# Patient Record
Sex: Male | Born: 1995 | Race: White | Hispanic: No | Marital: Single | Smoking: Never smoker
Health system: Southern US, Community
[De-identification: ages and names within clinical notes are randomized; demographics above are authoritative.]

## PROBLEM LIST (undated history)

## (undated) DIAGNOSIS — F32A Depression, unspecified: Secondary | ICD-10-CM

## (undated) DIAGNOSIS — F329 Major depressive disorder, single episode, unspecified: Secondary | ICD-10-CM

## (undated) DIAGNOSIS — F429 Obsessive-compulsive disorder, unspecified: Secondary | ICD-10-CM

## (undated) DIAGNOSIS — F419 Anxiety disorder, unspecified: Secondary | ICD-10-CM

## (undated) HISTORY — PX: TONSILLECTOMY: SUR1361

## (undated) HISTORY — DX: Anxiety disorder, unspecified: F41.9

## (undated) HISTORY — PX: IM NAILING TIBIA: SUR734

## (undated) HISTORY — DX: Obsessive-compulsive disorder, unspecified: F42.9

---
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
## 2014-01-07 ENCOUNTER — Other Ambulatory Visit: Payer: Self-pay | Admitting: Pediatrics

## 2014-01-07 ENCOUNTER — Telehealth: Payer: Self-pay | Admitting: Pediatrics

## 2014-01-07 DIAGNOSIS — F429 Obsessive-compulsive disorder, unspecified: Secondary | ICD-10-CM

## 2014-01-07 MED ORDER — SERTRALINE HCL 100 MG PO TABS: ORAL_TABLET | ORAL | Status: DC

## 2014-01-07 NOTE — Telephone Encounter (Signed)
i can give him 3 months worth now, but he needs to transition to an adult provider now that he is over 65 y.

## 2014-01-07 NOTE — Telephone Encounter (Signed)
Mom was calling in regards to patient wanting to know if he needs an appt to refill his sertraline script. Please advise.

## 2014-01-07 NOTE — Telephone Encounter (Signed)
See Below:

## 2014-04-21 ENCOUNTER — Ambulatory Visit (INDEPENDENT_AMBULATORY_CARE_PROVIDER_SITE_OTHER): Payer: Medicaid Other | Admitting: Pediatrics

## 2014-04-21 ENCOUNTER — Encounter: Payer: Self-pay | Admitting: Pediatrics

## 2014-04-21 VITALS — BP 118/60 | Wt 170.0 lb

## 2014-04-21 DIAGNOSIS — F429 Obsessive-compulsive disorder, unspecified: Secondary | ICD-10-CM

## 2014-04-21 MED ORDER — SERTRALINE HCL 100 MG PO TABS: ORAL_TABLET | ORAL | Status: DC

## 2014-04-21 NOTE — Progress Notes (Signed)
   Subjective:    Patient ID: Ricky [name], male    DOB: 03-Jun-1996, 18 y.o.   MRN: [id number]  HPI 18 year old male here for refill on Zoloft for OCD. He is doing great in college with no complaints or concerns about his OCD.   Review of Systems Noncontributory    Objective:   Physical Exam Alert and oriented Throat clear Ears clear Neck no adenopathy or thyromegaly Lungs clear to auscultation Heart regular rhythm without murmur Abdomen soft without masses       Assessment & Plan:  OCD good control on Zoloft Plan Zoloft 200 mg daily Return for physical and late November or December

## 2014-11-17 ENCOUNTER — Ambulatory Visit (INDEPENDENT_AMBULATORY_CARE_PROVIDER_SITE_OTHER): Payer: Medicaid Other | Admitting: Pediatrics

## 2014-11-17 ENCOUNTER — Encounter: Payer: Self-pay | Admitting: Pediatrics

## 2014-11-17 VITALS — BP 100/62

## 2014-11-17 DIAGNOSIS — F429 Obsessive-compulsive disorder, unspecified: Secondary | ICD-10-CM

## 2014-11-17 DIAGNOSIS — F42 Obsessive-compulsive disorder: Secondary | ICD-10-CM | POA: Diagnosis not present

## 2014-11-17 MED ORDER — SERTRALINE HCL 100 MG PO TABS: ORAL_TABLET | ORAL | Status: DC

## 2014-11-17 NOTE — Patient Instructions (Signed)
Please continue medications as prescribed We will see you back in 2-3 months for your physical   Sertraline tablets What is this medicine? SERTRALINE (SER tra leen) is used to treat depression. It may also be used to treat obsessive compulsive disorder, panic disorder, post-trauma stress, premenstrual dysphoric disorder (PMDD) or social anxiety. This medicine may be used for other purposes; ask your health care provider or pharmacist if you have questions. COMMON BRAND NAME(S): Zoloft What should I tell my health care provider before I take this medicine? They need to know if you have any of these conditions: -bipolar disorder or a family history of bipolar disorder -diabetes -glaucoma -heart disease -high blood pressure -history of irregular heartbeat -history of low levels of calcium, magnesium, or potassium in the blood -if you often drink alcohol -liver disease -receiving electroconvulsive therapy -seizures -suicidal thoughts, plans, or attempt; a previous suicide attempt by you or a family member -thyroid disease -an unusual or allergic reaction to sertraline, other medicines, foods, dyes, or preservatives -pregnant or trying to get pregnant -breast-feeding How should I use this medicine? Take this medicine by mouth with a glass of water. Follow the directions on the prescription label. You can take it with or without food. Take your medicine at regular intervals. Do not take your medicine more often than directed. Do not stop taking this medicine suddenly except upon the advice of your doctor. Stopping this medicine too quickly may cause serious side effects or your condition may worsen. A special MedGuide will be given to you by the pharmacist with each prescription and refill. Be sure to read this information carefully each time. Talk to your pediatrician regarding the use of this medicine in children. While this drug may be prescribed for children as young as 7 years for  selected conditions, precautions do apply. Overdosage: If you think you have taken too much of this medicine contact a poison control center or emergency room at once. NOTE: This medicine is only for you. Do not share this medicine with others. What if I miss a dose? If you miss a dose, take it as soon as you can. If it is almost time for your next dose, take only that dose. Do not take double or extra doses. What may interact with this medicine? Do not take this medicine with any of the following medications: -certain medicines for fungal infections like fluconazole, itraconazole, ketoconazole, posaconazole, voriconazole -cisapride -disulfiram -dofetilide -linezolid -MAOIs like Carbex, Eldepryl, Marplan, Nardil, and Parnate -metronidazole -methylene blue (injected into a vein) -pimozide -thioridazine -ziprasidone This medicine may also interact with the following medications: -alcohol -aspirin and aspirin-like medicines -certain medicines for depression, anxiety, or psychotic disturbances -certain medicines for irregular heart beat like flecainide, propafenone -certain medicines for migraine headaches like almotriptan, eletriptan, frovatriptan, naratriptan, rizatriptan, sumatriptan, zolmitriptan -certain medicines for sleep -certain medicines for seizures like carbamazepine, valproic acid, phenytoin -certain medicines that treat or prevent blood clots like warfarin, enoxaparin, dalteparin -cimetidine -digoxin -diuretics -fentanyl -furazolidone -isoniazid -lithium -NSAIDs, medicines for pain and inflammation, like ibuprofen or naproxen -other medicines that prolong the QT interval (cause an abnormal heart rhythm) -procarbazine -rasagiline -supplements like St. John's wort, kava kava, valerian -tolbutamide -tramadol -tryptophan This list may not describe all possible interactions. Give your health care provider a list of all the medicines, herbs, non-prescription drugs, or  dietary supplements you use. Also tell them if you smoke, drink alcohol, or use illegal drugs. Some items may interact with your medicine. What should I watch for  while using this medicine? Tell your doctor if your symptoms do not get better or if they get worse. Visit your doctor or health care professional for regular checks on your progress. Because it may take several weeks to see the full effects of this medicine, it is important to continue your treatment as prescribed by your doctor. Patients and their families should watch out for new or worsening thoughts of suicide or depression. Also watch out for sudden changes in feelings such as feeling anxious, agitated, panicky, irritable, hostile, aggressive, impulsive, severely restless, overly excited and hyperactive, or not being able to sleep. If this happens, especially at the beginning of treatment or after a change in dose, call your health care professional. Bonita Quin may get drowsy or dizzy. Do not drive, use machinery, or do anything that needs mental alertness until you know how this medicine affects you. Do not stand or sit up quickly, especially if you are an older patient. This reduces the risk of dizzy or fainting spells. Alcohol may interfere with the effect of this medicine. Avoid alcoholic drinks. Your mouth may get dry. Chewing sugarless gum or sucking hard candy, and drinking plenty of water may help. Contact your doctor if the problem does not go away or is severe. What side effects may I notice from receiving this medicine? Side effects that you should report to your doctor or health care professional as soon as possible: -allergic reactions like skin rash, itching or hives, swelling of the face, lips, or tongue -black or bloody stools, blood in the urine or vomit -fast, irregular heartbeat -feeling faint or lightheaded, falls -hallucination, loss of contact with reality -seizures -suicidal thoughts or other mood changes -unusual  bleeding or bruising -unusually weak or tired -vomiting Side effects that usually do not require medical attention (report to your doctor or health care professional if they continue or are bothersome): -change in appetite -change in sex drive or performance -diarrhea -increased sweating -indigestion, nausea -tremors This list may not describe all possible side effects. Call your doctor for medical advice about side effects. You may report side effects to FDA at 1-800-FDA-1088. Where should I keep my medicine? Keep out of the reach of children. Store at room temperature between 15 and 30 degrees C (59 and 86 degrees F). Throw away any unused medicine after the expiration date. NOTE: This sheet is a summary. It may not cover all possible information. If you have questions about this medicine, talk to your doctor, pharmacist, or health care provider.  2015, Elsevier/Gold Standard. (2013-03-02 12:57:35)

## 2014-11-17 NOTE — Progress Notes (Signed)
History was provided by the patient.  Ricky [name] is a 19 y.o. male who is here for medication refill.     HPI:   Ricky [name] is here for his medication refill. Ricky [name] has been on 200mg  of zoloft for almost 6 years for his OCD. He was diagnosed when he was 11 and had started on zoloft because of his depression and OCD. Now he has been doing great on his medication. He is currently in college at [employer] and has been doing great at school. He has not had any problems with it--good sleep, good appetite, working out frequently and building muscle. Denies current or past hx of SI and has overall been doing well.   The following portions of the patient's history were reviewed and updated as appropriate: allergies, current medications, past family history, past medical history, past social history, past surgical history and problem list.  Review of Symptoms: History obtained from the patient. General ROS: negative Psychological ROS: positive for - OCD ENT ROS: negative Allergy and Immunology ROS: negative Cardiovascular ROS: no chest pain or dyspnea on exertion Gastrointestinal ROS: no abdominal pain, change in bowel habits, or black or bloody stools Urinary ROS: no dysuria, trouble voiding or hematuria Musculoskeletal ROS: negative Neurological ROS: negative Dermatological ROS: negative  Physical Exam:  BP 100/62 mmHg  No height on file for this encounter. No LMP for male patient.    General:   alert, cooperative, appears stated age and no distress     Skin:   normal  Oral cavity:   lips, mucosa, and tongue normal; teeth and gums normal  Eyes:   sclerae white, pupils equal and reactive, red reflex normal bilaterally  Ears:   normal bilaterally  Nose: clear, no discharge  Neck:  Neck appearance: Normal  Lungs:  clear to auscultation bilaterally  Heart:   regular rate and rhythm, S1, S2 normal, no murmur, click, rub or gallop   Abdomen:  soft, non-tender; bowel sounds normal; no masses,  no  organomegaly  GU:  not examined  Extremities:   extremities normal, atraumatic, no cyanosis or edema  Neuro:  normal without focal findings, mental status, speech normal, alert and oriented x3 and PERLA    Assessment/Plan: Ricky [name] is a [age]yr old male with a hx of OCD and depression currently very well managed and controlled with zoloft, without significant side effects. -Will refill his medication today -Will have Ricky [name] make an appt for 2-3 months from now for his annual physical   - [facility] or [facility] as needed.    Lurene ShadowKavithashree Haaris Metallo, MD  11/17/2014

## 2015-02-08 ENCOUNTER — Ambulatory Visit: Payer: Medicaid Other | Admitting: Pediatrics

## 2015-02-16 ENCOUNTER — Ambulatory Visit: Payer: Medicaid Other | Admitting: Pediatrics

## 2015-07-06 ENCOUNTER — Encounter: Payer: Self-pay | Admitting: Pediatrics

## 2015-07-06 ENCOUNTER — Ambulatory Visit (INDEPENDENT_AMBULATORY_CARE_PROVIDER_SITE_OTHER): Payer: Medicaid Other | Admitting: Pediatrics

## 2015-07-06 VITALS — BP 122/80 | Wt 169.2 lb

## 2015-07-06 DIAGNOSIS — F429 Obsessive-compulsive disorder, unspecified: Secondary | ICD-10-CM

## 2015-07-06 DIAGNOSIS — F329 Major depressive disorder, single episode, unspecified: Secondary | ICD-10-CM | POA: Diagnosis not present

## 2015-07-06 DIAGNOSIS — F32A Depression, unspecified: Secondary | ICD-10-CM | POA: Insufficient documentation

## 2015-07-06 MED ORDER — SERTRALINE HCL 100 MG PO TABS
ORAL_TABLET | ORAL | Status: DC
Start: 2015-07-06 — End: 2016-02-02

## 2015-07-06 NOTE — Patient Instructions (Signed)
-  Please continue the medication daily -Please also look for an adult doctor

## 2015-07-06 NOTE — Progress Notes (Signed)
History was provided by the patient.  Ricky [name] is a 19 y.o. male who is here for OCD medication refill     HPI:   -Has been doing well with the current dose of zoloft. Had been on it for his OCD and depression and both have been extremely well controlled with his zoloft. Has been on it for years without any trouble or noted side effects accept for some mild memory loss that hasn't been severe. Denies any sleep disturbance, decreased energy, SI, guilt, concentration, appetite change. -In college as well, second year, doing good. No big problems overall with ADHD.  The following portions of the patient's history were reviewed and updated as appropriate:  He  has a past medical history of OCD (obsessive compulsive disorder). He  does not have any pertinent problems on file. He  has no past surgical history on file. His family history includes Cancer in his paternal grandfather; Diabetes in his maternal grandmother; Kidney disease in his maternal aunt and maternal grandmother. He  reports that he has never smoked. He has never used smokeless tobacco. He reports that he does not drink alcohol or use illicit drugs. He has a current medication list which includes the following prescription(s): sertraline and sulfamethoxazole-trimethoprim. Current Outpatient Prescriptions on File Prior to Visit  Medication Sig Dispense Refill  . sulfamethoxazole-trimethoprim (BACTRIM DS) 800-160 MG per tablet Take 1 tablet by mouth 2 (two) times daily. 20 tablet 0   No current facility-administered medications on file prior to visit.   He has No Known Allergies..  ROS: Gen: Negative HEENT: negative CV: Negative Resp: Negative GI: Negative GU: negative Neuro: Negative Skin: negative   Physical Exam:  BP 122/80 mmHg  Wt 169 lb 3.2 oz (76.749 kg)  Facility age limit for growth percentiles is 20 years. No LMP for male patient.  Gen: Awake, alert, in NAD HEENT: PERRL, EOMI, no significant  injection of conjunctiva, or nasal congestion, TMs normal b/l, tonsils 2+ without significant erythema or exudate Musc: Neck Supple  Lymph: No significant LAD Resp: Breathing comfortably, good air entry b/l, CTAB CV: RRR, S1, S2, no m/r/g, peripheral pulses 2+ GI: Soft, NTND, normoactive bowel sounds, no signs of HSM Neuro: AAOx3 Skin: WWP   Assessment/Plan: Ricky [name] is a 19yo M with a hx of ODD and depression stable on zoloft and overall doing well. -Will refill the zoloft for 6 months, counseled on medication and side effects, discussed SI/warning signs -To look at transfer to adult medicine    Lurene ShadowKavithashree Dayjah Selman, MD   07/06/2015

## 2016-02-02 ENCOUNTER — Telehealth: Payer: Self-pay | Admitting: *Deleted

## 2016-02-02 DIAGNOSIS — F429 Obsessive-compulsive disorder, unspecified: Secondary | ICD-10-CM

## 2016-02-02 DIAGNOSIS — F329 Major depressive disorder, single episode, unspecified: Secondary | ICD-10-CM

## 2016-02-02 DIAGNOSIS — F32A Depression, unspecified: Secondary | ICD-10-CM

## 2016-02-02 MED ORDER — SERTRALINE HCL 100 MG PO TABS: ORAL_TABLET | ORAL | Status: DC

## 2016-02-02 NOTE — Telephone Encounter (Signed)
Pt states he is in the process of looking for an adult PCP, but is having trouble finding one, and took the last of his Zoloft this morning, wondering if we can send him a refill, while in the process of transitioning, please advise.

## 2016-02-02 NOTE — Telephone Encounter (Signed)
Pt informed

## 2016-02-02 NOTE — Telephone Encounter (Signed)
Sent in two months supply while he looks for a new doctor.  Lurene ShadowKavithashree Danniel Tones, MD

## 2016-02-15 ENCOUNTER — Encounter: Payer: Self-pay | Admitting: Pediatrics

## 2016-04-17 ENCOUNTER — Telehealth: Payer: Self-pay

## 2016-04-17 NOTE — Telephone Encounter (Signed)
Patient's mother called and stated that patent was almost out of his medication. Mom was made aware that due to patients age I was not allowed to discuss his medical information with her.  She did state that he had been told that he needed to find an adult physician and that he was not looking for one and now he is in need of a refill and does not know what to do. I did inform her that there is a [facility]. Mom was given the contact information to try to schedule with their office if that is what his is needing to do.

## 2016-04-17 NOTE — Telephone Encounter (Signed)
Patient called and was very upset and stated that he needed a refill on his medication ASAP. He was having withdrawals and needs his medicine now.  Patient was informed that the doctor was not in the office at this moment and that a note will be put in to inform her of what is needed. Patient wants a refill on Sertaline 200 mg NOW.

## 2016-04-18 ENCOUNTER — Encounter: Payer: Self-pay | Admitting: Family Medicine

## 2016-04-18 ENCOUNTER — Ambulatory Visit (INDEPENDENT_AMBULATORY_CARE_PROVIDER_SITE_OTHER): Payer: Self-pay | Admitting: Family Medicine

## 2016-04-18 VITALS — BP 116/70 | HR 60 | Resp 18 | Ht 68.5 in | Wt 166.0 lb

## 2016-04-18 DIAGNOSIS — Z7689 Persons encountering health services in other specified circumstances: Secondary | ICD-10-CM

## 2016-04-18 DIAGNOSIS — F329 Major depressive disorder, single episode, unspecified: Secondary | ICD-10-CM

## 2016-04-18 DIAGNOSIS — F32A Depression, unspecified: Secondary | ICD-10-CM

## 2016-04-18 DIAGNOSIS — F429 Obsessive-compulsive disorder, unspecified: Secondary | ICD-10-CM

## 2016-04-18 DIAGNOSIS — Z7189 Other specified counseling: Secondary | ICD-10-CM

## 2016-04-18 MED ORDER — SERTRALINE HCL 100 MG PO TABS: ORAL_TABLET | ORAL | 2 refills | Status: DC

## 2016-04-18 NOTE — Patient Instructions (Signed)
Continue to eat well and exercise  Take the zoloft daily  Consider the HPV ( human papilloma virus) vaccination  See me every 6 months

## 2016-04-18 NOTE — Telephone Encounter (Signed)
Has an appt with new doctor today.  Ricky ShadowKavithashree Mashayla Lavin, MD

## 2016-04-18 NOTE — Progress Notes (Signed)
Chief Complaint  Patient presents with  . Establish Care    previous pcp Dr. Susanne Borders with [facility]   Ricky [name] is a new patient. Previously under the care of his pediatrician. He is here to establish care. He has been treated for OCD and depression for many years. He takes Zoloft 200 mg a day. The successfully controls his symptoms. He eats well and feels well. He is very interested in fitness and body building. He works full-time at [employer]. He has done 2 years of college. He is interested in pursuing a career as a Systems analyst.  He currently does not have any depression or OcD symptoms. He does not smoke cigarettes or drink alcohol. His medical history is reviewed. His immunization history is reviewed. Interestingly, he has not been offered the HPV vaccination series and is not aware of the importance of HPV prevention. This is discussed in detail. I also recommended a flu shot. He declined both of these immunizations today.   Patient Active Problem List   Diagnosis Date Noted  . Depression 07/06/2015  . OCD (obsessive compulsive disorder) 01/04/2013    Outpatient Encounter Prescriptions as of 04/18/2016  Medication Sig  . sertraline (ZOLOFT) 100 MG tablet Two tabs in am.   No facility-administered encounter medications on file as of 04/18/2016.     Past Medical History:  Diagnosis Date  . OCD (obsessive compulsive disorder)    on zoloft    Past Surgical History:  Procedure Laterality Date  . TONSILLECTOMY     7 yr     Social History   Social History  . Marital status: Single    Spouse name: N/A  . Number of children: N/A  . Years of education: N/A   Occupational History  . manager [employer]   Social History Main Topics  . Smoking status: Never Smoker  . Smokeless tobacco: Never Used  . Alcohol use No  . Drug use: No  . Sexual activity: Yes    Partners: Female    Birth control/ protection: Condom   Other Topics Concern  . Not  on file   Social History Narrative   Weight training - competitive body building   Lives home mother    Family History  Problem Relation Age of Onset  . Cancer Paternal Grandfather   . Cancer Maternal Aunt     breast  . Kidney disease Maternal Grandmother   . Diabetes Maternal Grandmother   . Early death Father     suicide   . Mental illness Father   . Cancer Paternal Grandmother     breast    Review of Systems  Constitutional: Negative for chills, fever and weight loss.  HENT: Negative for congestion and hearing loss.   Eyes: Negative for blurred vision and pain.  Respiratory: Negative for cough and shortness of breath.   Cardiovascular: Negative for chest pain, palpitations and leg swelling.  Gastrointestinal: Negative for constipation, diarrhea and heartburn.  Genitourinary: Negative for dysuria and frequency.  Musculoskeletal: Negative for joint pain and myalgias.  Neurological: Negative for dizziness, seizures and headaches.  Endo/Heme/Allergies: Negative for environmental allergies. Does not bruise/bleed easily.  Psychiatric/Behavioral: Negative for depression, substance abuse and suicidal ideas. The patient is not nervous/anxious and does not have insomnia.     BP 116/70   Pulse 60   Resp 18   Ht 5' 8.5" (1.74 m)   Wt 166 lb (75.3 kg)   SpO2 98%   BMI 24.87 kg/m  Physical Exam  Constitutional: He is oriented to person, place, and time. He appears well-developed and well-nourished.  Lean and muscular  HENT:  Head: Normocephalic and atraumatic.  Mouth/Throat: Oropharynx is clear and moist.  Eyes: Conjunctivae are normal. Pupils are equal, round, and reactive to light.  Neck: Normal range of motion. Neck supple. No thyromegaly present.  Cardiovascular: Normal rate, regular rhythm and normal heart sounds.   Pulmonary/Chest: Effort normal and breath sounds normal. No respiratory distress.  Abdominal: Soft. Bowel sounds are normal.  Musculoskeletal: Normal  range of motion. He exhibits no edema.  Lymphadenopathy:    He has no cervical adenopathy.  Neurological: He is alert and oriented to person, place, and time.  Gait normal  Skin: Skin is warm and dry.  Psychiatric: He has a normal mood and affect. His behavior is normal. Thought content normal.  Nursing note and vitals reviewed.   1. Encounter to establish care with new doctor   2. OCD (obsessive compulsive disorder)  - sertraline (ZOLOFT) 100 MG tablet; Two tabs in am.  Dispense: 180 tablet; Refill: 2  3. Depression  - sertraline (ZOLOFT) 100 MG tablet; Two tabs in am.  Dispense: 180 tablet; Refill: 2   Patient Instructions  Continue to eat well and exercise  Take the zoloft daily  Consider the HPV ( human papilloma virus) vaccination  See me every 6 months   Eustace MooreYvonne Sue Tashica Provencio, MD

## 2016-10-14 ENCOUNTER — Ambulatory Visit: Payer: Self-pay | Admitting: Family Medicine

## 2016-11-07 ENCOUNTER — Inpatient Hospital Stay (HOSPITAL_COMMUNITY): Payer: Self-pay

## 2016-11-07 ENCOUNTER — Emergency Department (HOSPITAL_COMMUNITY): Payer: Self-pay

## 2016-11-07 ENCOUNTER — Inpatient Hospital Stay (HOSPITAL_COMMUNITY): Payer: Self-pay | Admitting: Anesthesiology

## 2016-11-07 ENCOUNTER — Encounter (HOSPITAL_COMMUNITY): Admission: EM | Disposition: A | Discharge status: Home / Self Care | Payer: Self-pay | Source: Home / Self Care

## 2016-11-07 ENCOUNTER — Inpatient Hospital Stay (HOSPITAL_COMMUNITY)
Admission: EM | Admit: 2016-11-07 | Discharge: 2016-11-09 | DRG: 494 | Disposition: A | Discharge status: Home / Self Care | Payer: Self-pay | Attending: General Surgery | Admitting: General Surgery

## 2016-11-07 ENCOUNTER — Encounter (HOSPITAL_COMMUNITY): Payer: Self-pay | Admitting: *Deleted

## 2016-11-07 DIAGNOSIS — Z818 Family history of other mental and behavioral disorders: Secondary | ICD-10-CM

## 2016-11-07 DIAGNOSIS — F329 Major depressive disorder, single episode, unspecified: Secondary | ICD-10-CM | POA: Diagnosis present

## 2016-11-07 DIAGNOSIS — Z79899 Other long term (current) drug therapy: Secondary | ICD-10-CM

## 2016-11-07 DIAGNOSIS — Z419 Encounter for procedure for purposes other than remedying health state, unspecified: Secondary | ICD-10-CM

## 2016-11-07 DIAGNOSIS — S82251B Displaced comminuted fracture of shaft of right tibia, initial encounter for open fracture type I or II: Secondary | ICD-10-CM

## 2016-11-07 DIAGNOSIS — R040 Epistaxis: Secondary | ICD-10-CM | POA: Diagnosis present

## 2016-11-07 DIAGNOSIS — S82401B Unspecified fracture of shaft of right fibula, initial encounter for open fracture type I or II: Secondary | ICD-10-CM | POA: Diagnosis present

## 2016-11-07 DIAGNOSIS — S82201B Unspecified fracture of shaft of right tibia, initial encounter for open fracture type I or II: Principal | ICD-10-CM | POA: Diagnosis present

## 2016-11-07 DIAGNOSIS — F429 Obsessive-compulsive disorder, unspecified: Secondary | ICD-10-CM | POA: Diagnosis present

## 2016-11-07 DIAGNOSIS — Y92410 Unspecified street and highway as the place of occurrence of the external cause: Secondary | ICD-10-CM

## 2016-11-07 DIAGNOSIS — S301XXA Contusion of abdominal wall, initial encounter: Secondary | ICD-10-CM | POA: Diagnosis present

## 2016-11-07 HISTORY — PX: TIBIA IM NAIL INSERTION: SHX2516

## 2016-11-07 HISTORY — DX: Depression, unspecified: F32.A

## 2016-11-07 HISTORY — PX: FRACTURE SURGERY: SHX138

## 2016-11-07 HISTORY — DX: Major depressive disorder, single episode, unspecified: F32.9

## 2016-11-07 LAB — COMPREHENSIVE METABOLIC PANEL
ALBUMIN: 3.9 g/dL (ref 3.5–5.0)
ALK PHOS: 49 U/L (ref 38–126)
ALT: 32 U/L (ref 17–63)
ANION GAP: 8 (ref 5–15)
AST: 33 U/L (ref 15–41)
BILIRUBIN TOTAL: 0.7 mg/dL (ref 0.3–1.2)
BUN: 11 mg/dL (ref 6–20)
CALCIUM: 8.9 mg/dL (ref 8.9–10.3)
CO2: 24 mmol/L (ref 22–32)
Chloride: 107 mmol/L (ref 101–111)
Creatinine, Ser: 1.27 mg/dL — ABNORMAL HIGH (ref 0.61–1.24)
GFR calc non Af Amer: >60 mL/min (ref >60)
GLUCOSE: 116 mg/dL — AB (ref 65–99)
POTASSIUM: 3.6 mmol/L (ref 3.5–5.1)
SODIUM: 139 mmol/L (ref 135–145)
TOTAL PROTEIN: 6.5 g/dL (ref 6.5–8.1)

## 2016-11-07 LAB — I-STAT CHEM 8, ED
BUN: 10 mg/dL (ref 6–20)
CALCIUM ION: 1.16 mmol/L (ref 1.15–1.40)
CHLORIDE: 105 mmol/L (ref 101–111)
Creatinine, Ser: 1.2 mg/dL (ref 0.61–1.24)
GLUCOSE: 113 mg/dL — AB (ref 65–99)
HEMATOCRIT: 39 % (ref 39.0–52.0)
Hemoglobin: 13.3 g/dL (ref 13.0–17.0)
Potassium: 3.5 mmol/L (ref 3.5–5.1)
SODIUM: 141 mmol/L (ref 135–145)
TCO2: 26 mmol/L (ref 0–100)

## 2016-11-07 LAB — URINALYSIS, ROUTINE W REFLEX MICROSCOPIC
BILIRUBIN URINE: NEGATIVE
GLUCOSE, UA: NEGATIVE mg/dL
KETONES UR: NEGATIVE mg/dL
LEUKOCYTES UA: NEGATIVE
NITRITE: NEGATIVE
Protein, ur: 30 mg/dL — AB
SQUAMOUS EPITHELIAL / LPF: NONE SEEN
Specific Gravity, Urine: 1.021 (ref 1.005–1.030)
pH: 7 (ref 5.0–8.0)

## 2016-11-07 LAB — CBC WITH DIFFERENTIAL/PLATELET
BASOS PCT: 0 %
Basophils Absolute: 0 10*3/uL (ref 0.0–0.1)
Eosinophils Absolute: 0.1 10*3/uL (ref 0.0–0.7)
Eosinophils Relative: 1 %
HEMATOCRIT: 40.6 % (ref 39.0–52.0)
HEMOGLOBIN: 13.4 g/dL (ref 13.0–17.0)
LYMPHS PCT: 9 %
Lymphs Abs: 1.3 10*3/uL (ref 0.7–4.0)
MCH: 27.8 pg (ref 26.0–34.0)
MCHC: 33 g/dL (ref 30.0–36.0)
MCV: 84.2 fL (ref 78.0–100.0)
MONO ABS: 1.2 10*3/uL — AB (ref 0.1–1.0)
Monocytes Relative: 8 %
NEUTROS ABS: 12.6 10*3/uL — AB (ref 1.7–7.7)
NEUTROS PCT: 82 %
Platelets: 217 10*3/uL (ref 150–400)
RBC: 4.82 MIL/uL (ref 4.22–5.81)
RDW: 13.2 % (ref 11.5–15.5)
WBC: 15.3 10*3/uL — ABNORMAL HIGH (ref 4.0–10.5)

## 2016-11-07 LAB — MRSA PCR SCREENING: MRSA by PCR: NEGATIVE

## 2016-11-07 SURGERY — INSERTION, INTRAMEDULLARY ROD, TIBIA
Anesthesia: General | Site: Leg Lower | Laterality: Right

## 2016-11-07 MED ORDER — CEFAZOLIN SODIUM-DEXTROSE 2-4 GM/100ML-% IV SOLN
2.0000 g | Freq: Three times a day (TID) | INTRAVENOUS | Status: AC
  Administered 2016-11-08 (×3): 2 g via INTRAVENOUS
  Filled 2016-11-07 (×3): qty 100

## 2016-11-07 MED ORDER — SERTRALINE HCL 100 MG PO TABS
200.0000 mg | ORAL_TABLET | Freq: Every day | ORAL | Status: DC
  Administered 2016-11-08 – 2016-11-09 (×2): 200 mg via ORAL
  Filled 2016-11-07 (×2): qty 2

## 2016-11-07 MED ORDER — CHLORHEXIDINE GLUCONATE 4 % EX LIQD: 60.0000 mL | Freq: Once | CUTANEOUS | Status: DC

## 2016-11-07 MED ORDER — ONDANSETRON HCL 4 MG/2ML IJ SOLN: 4.0000 mg | Freq: Four times a day (QID) | INTRAMUSCULAR | Status: DC | PRN

## 2016-11-07 MED ORDER — LIDOCAINE HCL (CARDIAC) 20 MG/ML IV SOLN
INTRAVENOUS | Status: DC | PRN
  Administered 2016-11-07: 100 mg via INTRAVENOUS

## 2016-11-07 MED ORDER — CHLORHEXIDINE GLUCONATE 4 % EX LIQD
60.0000 mL | Freq: Once | CUTANEOUS | Status: AC
  Administered 2016-11-07: 4 via TOPICAL

## 2016-11-07 MED ORDER — FENTANYL CITRATE (PF) 100 MCG/2ML IJ SOLN
INTRAMUSCULAR | Status: DC | PRN
  Administered 2016-11-07 (×3): 50 ug via INTRAVENOUS
  Administered 2016-11-07: 100 ug via INTRAVENOUS
  Administered 2016-11-07: 50 ug via INTRAVENOUS

## 2016-11-07 MED ORDER — ACETAMINOPHEN 325 MG PO TABS: 650.0000 mg | ORAL_TABLET | Freq: Four times a day (QID) | ORAL | Status: DC | PRN

## 2016-11-07 MED ORDER — CEFAZOLIN IN D5W 1 GM/50ML IV SOLN
1.0000 g | Freq: Three times a day (TID) | INTRAVENOUS | Status: DC
  Filled 2016-11-07 (×2): qty 50

## 2016-11-07 MED ORDER — SODIUM CHLORIDE 0.9 % IV SOLN
INTRAVENOUS | Status: DC
  Administered 2016-11-08: via INTRAVENOUS

## 2016-11-07 MED ORDER — PROMETHAZINE HCL 25 MG/ML IJ SOLN: 6.2500 mg | INTRAMUSCULAR | Status: DC | PRN

## 2016-11-07 MED ORDER — LACTATED RINGERS IV SOLN
INTRAVENOUS | Status: DC | PRN
  Administered 2016-11-07 (×2): via INTRAVENOUS

## 2016-11-07 MED ORDER — FENTANYL CITRATE (PF) 100 MCG/2ML IJ SOLN
INTRAMUSCULAR | Status: AC
  Filled 2016-11-07: qty 2

## 2016-11-07 MED ORDER — SODIUM CHLORIDE 0.9 % IR SOLN
Status: DC | PRN
Start: 2016-11-07 — End: 2016-11-07
  Administered 2016-11-07: 3000 mL
  Administered 2016-11-07: 1000 mL

## 2016-11-07 MED ORDER — MIDAZOLAM HCL 5 MG/5ML IJ SOLN
INTRAMUSCULAR | Status: DC | PRN
  Administered 2016-11-07 (×2): 2 mg via INTRAVENOUS

## 2016-11-07 MED ORDER — PANTOPRAZOLE SODIUM 40 MG PO TBEC
40.0000 mg | DELAYED_RELEASE_TABLET | Freq: Every day | ORAL | Status: DC
Start: 2016-11-07 — End: 2016-11-09
  Administered 2016-11-08 – 2016-11-09 (×2): 40 mg via ORAL
  Filled 2016-11-07 (×2): qty 1

## 2016-11-07 MED ORDER — HYDROMORPHONE HCL 1 MG/ML IJ SOLN
1.0000 mg | Freq: Once | INTRAMUSCULAR | Status: AC
  Administered 2016-11-07: 1 mg via INTRAVENOUS
  Filled 2016-11-07: qty 1

## 2016-11-07 MED ORDER — METOCLOPRAMIDE HCL 5 MG/ML IJ SOLN: 5.0000 mg | Freq: Three times a day (TID) | INTRAMUSCULAR | Status: DC | PRN

## 2016-11-07 MED ORDER — PROPOFOL 10 MG/ML IV BOLUS
INTRAVENOUS | Status: AC
  Filled 2016-11-07: qty 20

## 2016-11-07 MED ORDER — PROPOFOL 10 MG/ML IV BOLUS
INTRAVENOUS | Status: DC | PRN
  Administered 2016-11-07: 200 mg via INTRAVENOUS

## 2016-11-07 MED ORDER — METOCLOPRAMIDE HCL 5 MG PO TABS: 5.0000 mg | ORAL_TABLET | Freq: Three times a day (TID) | ORAL | Status: DC | PRN

## 2016-11-07 MED ORDER — CEFAZOLIN SODIUM-DEXTROSE 2-4 GM/100ML-% IV SOLN
2.0000 g | INTRAVENOUS | Status: AC
  Administered 2016-11-07: 2 g via INTRAVENOUS

## 2016-11-07 MED ORDER — MIDAZOLAM HCL 2 MG/2ML IJ SOLN
INTRAMUSCULAR | Status: AC
  Filled 2016-11-07: qty 2

## 2016-11-07 MED ORDER — HYDROMORPHONE HCL 1 MG/ML IJ SOLN
0.5000 mg | INTRAMUSCULAR | Status: DC | PRN
  Administered 2016-11-07 – 2016-11-09 (×9): 1 mg via INTRAVENOUS
  Filled 2016-11-07 (×9): qty 1

## 2016-11-07 MED ORDER — PANTOPRAZOLE SODIUM 40 MG IV SOLR: 40.0000 mg | Freq: Every day | INTRAVENOUS | Status: DC

## 2016-11-07 MED ORDER — ONDANSETRON HCL 4 MG/2ML IJ SOLN
4.0000 mg | Freq: Four times a day (QID) | INTRAMUSCULAR | Status: DC | PRN
Start: 2016-11-07 — End: 2016-11-09

## 2016-11-07 MED ORDER — POVIDONE-IODINE 10 % EX SWAB: 2.0000 "application " | Freq: Once | CUTANEOUS | Status: DC

## 2016-11-07 MED ORDER — ONDANSETRON HCL 4 MG PO TABS: 4.0000 mg | ORAL_TABLET | Freq: Four times a day (QID) | ORAL | Status: DC | PRN

## 2016-11-07 MED ORDER — CEFAZOLIN SODIUM-DEXTROSE 2-4 GM/100ML-% IV SOLN
2.0000 g | Freq: Four times a day (QID) | INTRAVENOUS | Status: DC
  Filled 2016-11-07 (×3): qty 100

## 2016-11-07 MED ORDER — ACETAMINOPHEN 650 MG RE SUPP: 650.0000 mg | Freq: Four times a day (QID) | RECTAL | Status: DC | PRN

## 2016-11-07 MED ORDER — METHOCARBAMOL 1000 MG/10ML IJ SOLN
500.0000 mg | Freq: Four times a day (QID) | INTRAVENOUS | Status: DC | PRN
  Administered 2016-11-08: 500 mg via INTRAVENOUS
  Filled 2016-11-07 (×4): qty 5

## 2016-11-07 MED ORDER — ONDANSETRON HCL 4 MG/2ML IJ SOLN
INTRAMUSCULAR | Status: DC | PRN
  Administered 2016-11-07: 4 mg via INTRAVENOUS

## 2016-11-07 MED ORDER — HYDROMORPHONE HCL 1 MG/ML IJ SOLN
1.0000 mg | Freq: Once | INTRAMUSCULAR | Status: AC
Start: 2016-11-07 — End: 2016-11-07
  Administered 2016-11-07: 1 mg via INTRAVENOUS
  Filled 2016-11-07: qty 1

## 2016-11-07 MED ORDER — ROCURONIUM BROMIDE 100 MG/10ML IV SOLN
INTRAVENOUS | Status: DC | PRN
  Administered 2016-11-07: 20 mg via INTRAVENOUS
  Administered 2016-11-07: 40 mg via INTRAVENOUS

## 2016-11-07 MED ORDER — FENTANYL CITRATE (PF) 100 MCG/2ML IJ SOLN
INTRAMUSCULAR | Status: AC
Start: 2016-11-07 — End: 2016-11-07
  Filled 2016-11-07: qty 2

## 2016-11-07 MED ORDER — LACTATED RINGERS IV SOLN
INTRAVENOUS | Status: DC
  Administered 2016-11-07: 1000 mL via INTRAVENOUS

## 2016-11-07 MED ORDER — IOPAMIDOL (ISOVUE-300) INJECTION 61%
INTRAVENOUS | Status: AC
  Administered 2016-11-07: 100 mL
  Filled 2016-11-07: qty 100

## 2016-11-07 MED ORDER — IOPAMIDOL (ISOVUE-300) INJECTION 61%
INTRAVENOUS | Status: AC
  Filled 2016-11-07: qty 100

## 2016-11-07 MED ORDER — METHOCARBAMOL 500 MG PO TABS
500.0000 mg | ORAL_TABLET | Freq: Four times a day (QID) | ORAL | Status: DC | PRN
  Administered 2016-11-08: 500 mg via ORAL
  Filled 2016-11-07: qty 1

## 2016-11-07 MED ORDER — KCL IN DEXTROSE-NACL 20-5-0.45 MEQ/L-%-% IV SOLN
INTRAVENOUS | Status: DC
Start: 2016-11-07 — End: 2016-11-09
  Administered 2016-11-07: 15:00:00 via INTRAVENOUS
  Filled 2016-11-07: qty 1000

## 2016-11-07 MED ORDER — TETANUS-DIPHTH-ACELL PERTUSSIS 5-2.5-18.5 LF-MCG/0.5 IM SUSP
0.5000 mL | Freq: Once | INTRAMUSCULAR | Status: AC
  Administered 2016-11-07: 0.5 mL via INTRAMUSCULAR
  Filled 2016-11-07: qty 0.5

## 2016-11-07 MED ORDER — CEFAZOLIN SODIUM-DEXTROSE 2-4 GM/100ML-% IV SOLN
2.0000 g | Freq: Once | INTRAVENOUS | Status: AC
  Administered 2016-11-07: 2 g via INTRAVENOUS
  Filled 2016-11-07: qty 100

## 2016-11-07 MED ORDER — SUCCINYLCHOLINE CHLORIDE 20 MG/ML IJ SOLN
INTRAMUSCULAR | Status: DC | PRN
  Administered 2016-11-07: 100 mg via INTRAVENOUS

## 2016-11-07 MED ORDER — FENTANYL CITRATE (PF) 100 MCG/2ML IJ SOLN: 25.0000 ug | INTRAMUSCULAR | Status: DC | PRN

## 2016-11-07 MED ORDER — ONDANSETRON HCL 4 MG/2ML IJ SOLN
4.0000 mg | Freq: Once | INTRAMUSCULAR | Status: AC
  Administered 2016-11-07: 4 mg via INTRAVENOUS
  Filled 2016-11-07: qty 2

## 2016-11-07 MED ORDER — SUGAMMADEX SODIUM 200 MG/2ML IV SOLN
INTRAVENOUS | Status: DC | PRN
  Administered 2016-11-07: 200 mg via INTRAVENOUS

## 2016-11-07 SURGICAL SUPPLY — 72 items
BANDAGE ACE 4X5 VEL STRL LF (GAUZE/BANDAGES/DRESSINGS) ×3 IMPLANT
BANDAGE ACE 6X5 VEL STRL LF (GAUZE/BANDAGES/DRESSINGS) ×3 IMPLANT
BANDAGE ESMARK 6X9 LF (GAUZE/BANDAGES/DRESSINGS) ×1 IMPLANT
BIT DRILL AO GAMMA 4.2X130 (BIT) ×3 IMPLANT
BIT DRILL AO GAMMA 4.2X180 (BIT) ×3 IMPLANT
BIT DRILL AO GAMMA 4.2X340 (BIT) ×3 IMPLANT
BIT DRILL AO STRL 3.5X130MM (BIT) ×2 IMPLANT
BNDG ESMARK 6X9 LF (GAUZE/BANDAGES/DRESSINGS) ×3
COVER MAYO STAND STRL (DRAPES) IMPLANT
COVER SURGICAL LIGHT HANDLE (MISCELLANEOUS) ×3 IMPLANT
CUFF TOURNIQUET SINGLE 34IN LL (TOURNIQUET CUFF) IMPLANT
DRAPE C-ARM 42X72 X-RAY (DRAPES) ×3 IMPLANT
DRAPE C-ARMOR (DRAPES) ×3 IMPLANT
DRAPE EXTREMITY TIBURON (DRAPES) ×3 IMPLANT
DRAPE HALF SHEET 40X57 (DRAPES) IMPLANT
DRAPE IMP U-DRAPE 54X76 (DRAPES) ×3 IMPLANT
DRAPE ORTHO SPLIT 77X108 STRL (DRAPES) ×4
DRAPE POUCH INSTRU U-SHP 10X18 (DRAPES) IMPLANT
DRAPE SURG ORHT 6 SPLT 77X108 (DRAPES) ×2 IMPLANT
DRAPE U-SHAPE 47X51 STRL (DRAPES) ×3 IMPLANT
DRAPE UTILITY XL STRL (DRAPES) IMPLANT
DRILL AO STERILE 3.5X130MM (BIT) ×6
DRSG ADAPTIC 3X8 NADH LF (GAUZE/BANDAGES/DRESSINGS) ×3 IMPLANT
DRSG PAD ABDOMINAL 8X10 ST (GAUZE/BANDAGES/DRESSINGS) ×6 IMPLANT
DURAPREP 26ML APPLICATOR (WOUND CARE) ×9 IMPLANT
ELECT CAUTERY BLADE 6.4 (BLADE) ×3 IMPLANT
ELECT REM PT RETURN 9FT ADLT (ELECTROSURGICAL) ×3
ELECTRODE REM PT RTRN 9FT ADLT (ELECTROSURGICAL) ×1 IMPLANT
END CAP TIBIAL T2 11.5X10 (Cap) ×3 IMPLANT
FACESHIELD WRAPAROUND (MASK) IMPLANT
GAUZE SPONGE 4X4 12PLY STRL (GAUZE/BANDAGES/DRESSINGS) ×6 IMPLANT
GAUZE XEROFORM 1X8 LF (GAUZE/BANDAGES/DRESSINGS) IMPLANT
GLOVE BIO SURGEON STRL SZ7.5 (GLOVE) ×3 IMPLANT
GLOVE BIOGEL PI IND STRL 8 (GLOVE) ×1 IMPLANT
GLOVE BIOGEL PI INDICATOR 8 (GLOVE) ×2
GLOVE INDICATOR 6.5 STRL GRN (GLOVE) ×3 IMPLANT
GOWN STRL REUS W/ TWL LRG LVL3 (GOWN DISPOSABLE) ×1 IMPLANT
GOWN STRL REUS W/ TWL XL LVL3 (GOWN DISPOSABLE) ×1 IMPLANT
GOWN STRL REUS W/TWL LRG LVL3 (GOWN DISPOSABLE) ×2
GOWN STRL REUS W/TWL XL LVL3 (GOWN DISPOSABLE) ×2
GUIDEROD T2 3X1000 (ROD) ×3 IMPLANT
GUIDEWIRE GAMMA (WIRE) ×6 IMPLANT
GUIDEWIRE GAMMA 800 (WIRE) ×3 IMPLANT
GUIDEWIRE GAMMA SM TIP 3X800MM (WIRE) ×3 IMPLANT
K-WIRE FIXATION 3X285 COATED (WIRE) ×6
KIT BASIN OR (CUSTOM PROCEDURE TRAY) ×3 IMPLANT
KWIRE FIXATION 3X285 COATED (WIRE) ×2 IMPLANT
MANIFOLD NEPTUNE II (INSTRUMENTS) ×3 IMPLANT
NAIL ELAS INSERT SLV SPI 8-11 ×2 IMPLANT
NAIL TIBIAL 8X345MM (Nail) ×2 IMPLANT
NS IRRIG 1000ML POUR BTL (IV SOLUTION) ×3 IMPLANT
PACK GENERAL/GYN (CUSTOM PROCEDURE TRAY) ×3 IMPLANT
PACK TOTAL JOINT (CUSTOM PROCEDURE TRAY) IMPLANT
PACK UNIVERSAL I (CUSTOM PROCEDURE TRAY) IMPLANT
PAD CAST 4YDX4 CTTN HI CHSV (CAST SUPPLIES) ×2 IMPLANT
PADDING CAST COTTON 4X4 STRL (CAST SUPPLIES) ×4
REAMER INTRAMEDULLARY 8MM 510 (MISCELLANEOUS) ×3 IMPLANT
SCREW LOCKING FT 4X40 (Screw) ×3 IMPLANT
SCREW LOCKING FULL THREAD 4X35 (Screw) ×3 IMPLANT
SCREW LOCKING T2 F/T  5MMX50MM (Screw) ×2 IMPLANT
SCREW LOCKING T2 F/T  5X37.5MM (Screw) ×2 IMPLANT
SCREW LOCKING T2 F/T 5MMX50MM (Screw) ×1 IMPLANT
SCREW LOCKING T2 F/T 5X37.5MM (Screw) ×1 IMPLANT
STAPLER SKIN PROX WIDE 3.9 (STAPLE) IMPLANT
SUT ETHILON 3 0 PS 1 (SUTURE) ×9 IMPLANT
SUT MON AB 2-0 CT1 36 (SUTURE) ×3 IMPLANT
SUT VIC AB 0 CT1 27 (SUTURE) ×2
SUT VIC AB 0 CT1 27XBRD ANBCTR (SUTURE) ×1 IMPLANT
TOWEL OR 17X26 10 PK STRL BLUE (TOWEL DISPOSABLE) ×6 IMPLANT
TUBE TEFLON ×3 IMPLANT
TUBE TEFLON T2 ×1 IMPLANT
WATER STERILE IRR 1000ML POUR (IV SOLUTION) IMPLANT

## 2016-11-07 NOTE — ED Notes (Signed)
[place] [facility] [place] 6 [facility]
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
## 2016-11-07 NOTE — Brief Op Note (Signed)
11/07/2016  8:28 PM  PATIENT:  Ricky [name]  21 y.o. male  PRE-OPERATIVE DIAGNOSIS:  Right Open Tib Fx  POST-OPERATIVE DIAGNOSIS:  Right Open Tib Fx  PROCEDURE:  Procedure(s): INTRAMEDULLARY (IM) NAIL TIBIAL RIGHT W/ IRRIGATION AND DEBRIDEMENT (Right)  SURGEON:  Surgeon(s) and Role:    * Yolonda KidaJason Patrick Zari Cly, MD - Primary  PHYSICIAN ASSISTANT:   ASSISTANTS: none   ANESTHESIA:   general  EBL:  Total I/O In: 1000 [I.V.:1000] Out: 250 [Blood:250]  BLOOD ADMINISTERED:none  DRAINS: none   LOCAL MEDICATIONS USED:  NONE  SPECIMEN:  No Specimen  DISPOSITION OF SPECIMEN:  N/A  COUNTS:  YES  TOURNIQUET:  * No tourniquets in log *  DICTATION: .Note written in EPIC  PLAN OF CARE: Admit to inpatient   PATIENT DISPOSITION:  PACU - hemodynamically stable.   Delay start of Pharmacological VTE agent (>24hrs) due to surgical blood loss or risk of bleeding: no
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
## 2016-11-07 NOTE — Anesthesia Procedure Notes (Signed)
Procedure Name: MAC Date/Time: 11/07/2016 6:02 PM Performed by: Purvis Kilts Pre-anesthesia Checklist: Patient identified, Emergency Drugs available, Suction available, Patient being monitored and Timeout performed Patient Re-evaluated:Patient Re-evaluated prior to inductionOxygen Delivery Method: Circle system utilized Preoxygenation: Pre-oxygenation with 100% oxygen Intubation Type: IV induction Laryngoscope Size: Mac and 4 Grade View: Grade I Tube type: Oral Tube size: 7.5 mm Number of attempts: 1 Placement Confirmation: ETT inserted through vocal cords under direct vision,  positive ETCO2 and breath sounds checked- equal and bilateral Secured at: 22 cm Tube secured with: Tape Dental Injury: Teeth and Oropharynx as per pre-operative assessment

## 2016-11-07 NOTE — Progress Notes (Signed)
Orthopedic Tech Progress Note Patient Details:  Marilynn Latinoyler D Kulak 1995/10/10 [id number]  Ortho Devices Type of Ortho Device: CAM walker Ortho Device/Splint Location: RLE Ortho Device/Splint Interventions: Casandra DoffingOrdered   Manjit Bufano Craig 11/07/2016, 9:40 PM

## 2016-11-07 NOTE — Anesthesia Preprocedure Evaluation (Addendum)
Anesthesia Evaluation  Patient identified by MRN, date of birth, ID band Patient awake    Reviewed: Allergy & Precautions, NPO status , Patient's Chart, lab work & pertinent test results  Airway Mallampati: I  TM Distance: >3 FB Neck ROM: Full    Dental  (+) Teeth Intact, Dental Advisory Given, Chipped,    Pulmonary neg pulmonary ROS,    Pulmonary exam normal breath sounds clear to auscultation       Cardiovascular Exercise Tolerance: Good negative cardio ROS Normal cardiovascular exam Rhythm:Regular Rate:Normal     Neuro/Psych PSYCHIATRIC DISORDERS (OCD) Depression negative neurological ROS     GI/Hepatic negative GI ROS, Neg liver ROS,   Endo/Other  negative endocrine ROS  Renal/GU negative Renal ROS     Musculoskeletal negative musculoskeletal ROS (+)   Abdominal   Peds negative pediatric ROS (+)  Hematology negative hematology ROS (+)   Anesthesia Other Findings Day of surgery medications reviewed with the patient.  Reproductive/Obstetrics                            Anesthesia Physical Anesthesia Plan  ASA: II  Anesthesia Plan: General   Post-op Pain Management:    Induction: Intravenous, Rapid sequence and Cricoid pressure planned  Airway Management Planned: Oral ETT  Additional Equipment:   Intra-op Plan:   Post-operative Plan: Extubation in OR  Informed Consent: I have reviewed the patients History and Physical, chart, labs and discussed the procedure including the risks, benefits and alternatives for the proposed anesthesia with the patient or authorized representative who has indicated his/her understanding and acceptance.   Dental advisory given  Plan Discussed with: CRNA  Anesthesia Plan Comments: (Risks/benefits of general anesthesia discussed with patient including risk of damage to teeth, lips, gum, and tongue, nausea/vomiting, allergic reactions to  medications, and the possibility of heart attack, stroke and death.  All patient questions answered.  Patient wishes to proceed.)       Anesthesia Quick Evaluation
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
## 2016-11-07 NOTE — Anesthesia Postprocedure Evaluation (Addendum)
Anesthesia Post Note  Patient: Gaetana Michaelisyler D Smyers  Procedure(s) Performed: Procedure(s) (LRB): INTRAMEDULLARY (IM) NAIL TIBIAL RIGHT W/ IRRIGATION AND DEBRIDEMENT (Right)  Patient location during evaluation: PACU Anesthesia Type: General Level of consciousness: awake and alert, oriented and patient cooperative Pain management: pain level controlled Vital Signs Assessment: post-procedure vital signs reviewed and stable Respiratory status: spontaneous breathing, nonlabored ventilation, respiratory function stable and patient connected to nasal cannula oxygen Cardiovascular status: blood pressure returned to baseline and stable Postop Assessment: no signs of nausea or vomiting Anesthetic complications: no       Last Vitals:  Vitals:   11/07/16 2053 11/07/16 2106  BP: (!) 152/81 (!) 147/78  Pulse: 75 63  Resp: 20 20  Temp:  36.3 C    Last Pain:  Vitals:   11/07/16 2106  TempSrc:   PainSc: Asleep                 Kyung Muto,E. Marni Franzoni

## 2016-11-07 NOTE — Transfer of Care (Signed)
Immediate Anesthesia Transfer of Care Note  Patient: Ricky [name]  Procedure(s) Performed: Procedure(s): INTRAMEDULLARY (IM) NAIL TIBIAL RIGHT W/ IRRIGATION AND DEBRIDEMENT (Right)  Patient Location: PACU  Anesthesia Type:General  Level of Consciousness: awake  Airway & Oxygen Therapy: Patient Spontanous Breathing and Patient connected to nasal cannula oxygen  Post-op Assessment: Report given to RN and Post -op Vital signs reviewed and stable  Post vital signs: Reviewed and stable  Last Vitals:  Vitals:   11/07/16 1633 11/07/16 2023  BP:  (!) 153/56  Pulse:  (!) 106  Resp:  (!) 22  Temp: 37.7 C 36.3 C    Last Pain:  Vitals:   11/07/16 2023  TempSrc:   PainSc: Asleep         Complications: No apparent anesthesia complications

## 2016-11-07 NOTE — ED Notes (Signed)
Returned from xray
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
## 2016-11-07 NOTE — ED Notes (Signed)
Mother here Arline AspCindy wallet given to her.

## 2016-11-07 NOTE — Progress Notes (Signed)
Orthopedic Tech Progress Note Patient Details:  Marilynn Latinoyler D Piggott [birth year] [id number]  Ortho Devices Ortho Device/Splint Location: rle Ortho Device/Splint Interventions: Application   Okey Duprerawford, Henli Hey 11/07/2016, 11:59 AM
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
## 2016-11-08 LAB — CBC
HCT: 39.1 % (ref 39.0–52.0)
Hemoglobin: 13 g/dL (ref 13.0–17.0)
MCH: 28.2 pg (ref 26.0–34.0)
MCHC: 33.2 g/dL (ref 30.0–36.0)
MCV: 84.8 fL (ref 78.0–100.0)
PLATELETS: 212 10*3/uL (ref 150–400)
RBC: 4.61 MIL/uL (ref 4.22–5.81)
RDW: 13.3 % (ref 11.5–15.5)
WBC: 12.1 10*3/uL — AB (ref 4.0–10.5)

## 2016-11-08 LAB — BASIC METABOLIC PANEL
ANION GAP: 11 (ref 5–15)
BUN: 6 mg/dL (ref 6–20)
CALCIUM: 8.9 mg/dL (ref 8.9–10.3)
CO2: 25 mmol/L (ref 22–32)
Chloride: 100 mmol/L — ABNORMAL LOW (ref 101–111)
Creatinine, Ser: 1.07 mg/dL (ref 0.61–1.24)
GFR calc Af Amer: >60 mL/min (ref >60)
GFR calc non Af Amer: >60 mL/min (ref >60)
GLUCOSE: 123 mg/dL — AB (ref 65–99)
Potassium: 4 mmol/L (ref 3.5–5.1)
Sodium: 136 mmol/L (ref 135–145)

## 2016-11-08 LAB — HIV ANTIBODY (ROUTINE TESTING W REFLEX): HIV Screen 4th Generation wRfx: NONREACTIVE

## 2016-11-08 MED ORDER — ACETAMINOPHEN 325 MG PO TABS
650.0000 mg | ORAL_TABLET | Freq: Four times a day (QID) | ORAL | Status: DC
  Administered 2016-11-08 – 2016-11-09 (×5): 650 mg via ORAL
  Filled 2016-11-08 (×5): qty 2

## 2016-11-08 MED ORDER — ENOXAPARIN SODIUM 40 MG/0.4ML ~~LOC~~ SOLN
40.0000 mg | SUBCUTANEOUS | Status: DC
  Administered 2016-11-08 – 2016-11-09 (×2): 40 mg via SUBCUTANEOUS
  Filled 2016-11-08 (×2): qty 0.4

## 2016-11-08 MED ORDER — OXYCODONE HCL 5 MG PO TABS
5.0000 mg | ORAL_TABLET | ORAL | Status: DC | PRN
  Administered 2016-11-08 – 2016-11-09 (×4): 5 mg via ORAL
  Filled 2016-11-08 (×4): qty 1
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
## 2016-11-08 NOTE — Progress Notes (Signed)
Orthopedic Tech Progress Note Patient Details:  Ricky [name] 1996/05/23 [id number]  Ortho Devices Type of Ortho Device: Crutches Ortho Device/Splint Location: RLE Ortho Device/Splint Interventions: Casandra DoffingOrdered   Dejanira Pamintuan Craig 11/08/2016, 3:25 PM

## 2016-11-08 NOTE — Discharge Instructions (Signed)
LEG FRACTURE: 25% weight bearing on right leg after surgery Return in 2 weeks for suture removal  4 weeks of postoperative DVT prophylaxis following discharge (lovenox 40 mg daily)
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
## 2016-11-08 NOTE — Progress Notes (Signed)
Paged Ortho MD regarding patient's diet since it is still NPO and had his recent surgery.  Dr. Aundria Rudogers called back and informed this RN that it is ok for the patient to have a diet but has to be cleared with the attending trauma MD. Paged on-call trauma MD, Dr. Magnus IvanBlackman and did not received any response yet.

## 2016-11-08 NOTE — Progress Notes (Signed)
Paged again on-call Trauma MD, Dr. Magnus IvanBlackman regarding patient's NPO diet.  Will await for reply or further order. Patient voided already 850 cc using urinal.

## 2016-11-08 NOTE — Progress Notes (Signed)
Received patient from PACU accompanied by RN.  Patient sleepy but easily arousable, VS stable, denies pain and able to wiggle and feel sensation on right toes.  Family members at bedside and Ortho tech brought CAM walker.  Will monitor.

## 2016-11-09 MED ORDER — METHOCARBAMOL 500 MG PO TABS: 500.0000 mg | ORAL_TABLET | Freq: Four times a day (QID) | ORAL | 0 refills | Status: DC | PRN

## 2016-11-09 MED ORDER — OXYCODONE HCL 5 MG PO TABS: 5.0000 mg | ORAL_TABLET | ORAL | 0 refills | Status: DC | PRN

## 2016-11-09 NOTE — Progress Notes (Signed)
Orthopedic Tech Progress Note Patient Details:  Ricky [name] July 26, 1996 [id number]  Ortho Devices Type of Ortho Device: Crutches Ortho Device/Splint Location: RLE Ortho Device/Splint Interventions: Application   Saul FordyceJennifer C Matas Burrows 11/09/2016, 3:52 PM
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
## 2016-11-09 NOTE — Progress Notes (Signed)
Crutches obtained for pt.  Pt and mom ready to go home.  DC instructions reviewed and copy given.

## 2016-11-12 ENCOUNTER — Encounter (HOSPITAL_COMMUNITY): Payer: Self-pay | Admitting: Orthopedic Surgery

## 2016-11-28 ENCOUNTER — Ambulatory Visit (HOSPITAL_COMMUNITY): Payer: No Typology Code available for payment source | Attending: Orthopedic Surgery | Admitting: Physical Therapy

## 2016-11-28 DIAGNOSIS — R600 Localized edema: Secondary | ICD-10-CM

## 2016-11-28 DIAGNOSIS — M25671 Stiffness of right ankle, not elsewhere classified: Secondary | ICD-10-CM | POA: Diagnosis present

## 2016-11-28 DIAGNOSIS — M25661 Stiffness of right knee, not elsewhere classified: Secondary | ICD-10-CM | POA: Diagnosis present

## 2016-11-28 DIAGNOSIS — R262 Difficulty in walking, not elsewhere classified: Secondary | ICD-10-CM | POA: Insufficient documentation

## 2016-11-28 DIAGNOSIS — M79661 Pain in right lower leg: Secondary | ICD-10-CM

## 2016-11-28 DIAGNOSIS — M6281 Muscle weakness (generalized): Secondary | ICD-10-CM | POA: Diagnosis present
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
## 2016-11-28 NOTE — Patient Instructions (Signed)
   ANKLE CIRCLES  Move your ankle in a circular pattern one direction for several repetitions and then reverse the direction.   Repeat 10 times clockwise and counter clockwise, 2-3 times per day.     ANKLE PUMPS - AP  Bend your foot up and down at your ankle joint as shown. Perform this exercise laying down.  Repeat 10-15 times, 2-3 times per day.    WIGGLE YOUR TOES AS MUCH AS POSSIBLE!!!!!      LONG ARC QUAD - LAQ - HIGH SEAT  While seated with your knee in a bent position, slowly straighten your knee as you raise your foot upwards as shown.   Repeat 10-15 times, 2-3 times per day.     WEIGHT SHIFT - LATERAL (WITH SCALE UNDER YOUR RIGHT FOOT)  While in a standing position and knees partially bent, and right foot on the scale, slowly shift your weight to the right until you reach 1/2 of your body weight on the scale.  You need to do this exercise either with your crutches or at the kitchen counter.  Repeat 5-10 times, twice a day.     REGULAR ICING  Cover the scab on your leg with gauze, and cover your entire knee with a towel to protect the incision/skin.  Elevate your leg and put an ice pack on the knee.  Ice for about 10-15 minutes and then remove.  Repeat at least 3 times per day.

## 2016-12-03 ENCOUNTER — Ambulatory Visit (HOSPITAL_COMMUNITY): Payer: No Typology Code available for payment source

## 2016-12-03 DIAGNOSIS — M79661 Pain in right lower leg: Secondary | ICD-10-CM | POA: Diagnosis not present

## 2016-12-03 DIAGNOSIS — R600 Localized edema: Secondary | ICD-10-CM

## 2016-12-03 DIAGNOSIS — M25661 Stiffness of right knee, not elsewhere classified: Secondary | ICD-10-CM

## 2016-12-03 DIAGNOSIS — R262 Difficulty in walking, not elsewhere classified: Secondary | ICD-10-CM

## 2016-12-03 DIAGNOSIS — M6281 Muscle weakness (generalized): Secondary | ICD-10-CM

## 2016-12-03 DIAGNOSIS — M25671 Stiffness of right ankle, not elsewhere classified: Secondary | ICD-10-CM

## 2016-12-03 NOTE — Patient Instructions (Signed)
Straight Leg Raise    Tighten stomach and slowly raise locked right leg 15 inches from floor. Repeat 15 times per set. Do 2 sets per session.   http://orth.exer.us/1102   Copyright  VHI. All rights reserved.   Abduction: Side Leg Lift (Eccentric) - Side-Lying    Lie on side. Lift top leg slightly higher than shoulder level. Keep top leg straight with body, toes pointing forward. Slowly lower for 3-5 seconds, 15 reps per set, 2 sets per day.  http://ecce.exer.us/62   Copyright  VHI. All rights reserved.   Straight Leg Raise (Prone)    Abdomen and head supported, keep left knee locked and raise leg at hip. Avoid arching low back. Repeat 15 times per set. Do 2 sets per session.   http://orth.exer.us/1112   Copyright  VHI. All rights reserved.
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
## 2016-12-06 ENCOUNTER — Ambulatory Visit (HOSPITAL_COMMUNITY): Payer: No Typology Code available for payment source

## 2016-12-06 DIAGNOSIS — M79661 Pain in right lower leg: Secondary | ICD-10-CM | POA: Diagnosis not present

## 2016-12-06 DIAGNOSIS — R262 Difficulty in walking, not elsewhere classified: Secondary | ICD-10-CM

## 2016-12-06 DIAGNOSIS — M25661 Stiffness of right knee, not elsewhere classified: Secondary | ICD-10-CM

## 2016-12-06 DIAGNOSIS — M6281 Muscle weakness (generalized): Secondary | ICD-10-CM

## 2016-12-06 DIAGNOSIS — R600 Localized edema: Secondary | ICD-10-CM

## 2016-12-06 DIAGNOSIS — M25671 Stiffness of right ankle, not elsewhere classified: Secondary | ICD-10-CM
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
## 2016-12-10 ENCOUNTER — Ambulatory Visit (HOSPITAL_COMMUNITY): Payer: No Typology Code available for payment source | Admitting: Physical Therapy

## 2016-12-10 DIAGNOSIS — M25661 Stiffness of right knee, not elsewhere classified: Secondary | ICD-10-CM

## 2016-12-10 DIAGNOSIS — M79661 Pain in right lower leg: Secondary | ICD-10-CM

## 2016-12-10 DIAGNOSIS — R600 Localized edema: Secondary | ICD-10-CM

## 2016-12-10 DIAGNOSIS — M6281 Muscle weakness (generalized): Secondary | ICD-10-CM

## 2016-12-10 DIAGNOSIS — M25671 Stiffness of right ankle, not elsewhere classified: Secondary | ICD-10-CM

## 2016-12-10 DIAGNOSIS — R262 Difficulty in walking, not elsewhere classified: Secondary | ICD-10-CM

## 2016-12-10 NOTE — Patient Instructions (Signed)
   SEATED CALF STRETCH - GASTROC  While sitting, use a towel or other strap looped around your foot. Gently pull your ankle back until a stretch is felt along the back of your lower leg.  Hold for 30 seconds and relax.   Repeat 3 times, 2-3 times per day.     HAMSTRING STRETCH WITH TOWEL  While lying down on your back, hook a towel or strap under  your foot and draw up your leg until a stretch is felt under your leg. calf area.  Keep your knee in a straightened position during the stretch.  Hold for 30 seconds then relax.   Repeat 3 times, 2-3 times per day.     SHORT ARC QUAD  - SAQ   Place a rolled up towel or object under your knee and slowly straighten your knee as your raise up  your foot.  You may use weights as tolerated.   Repeat 15-20 reps, 2-3 sets per day.    LONG ARC QUAD - LAQ - HIGH SEAT  While seated with your knee in a bent position, slowly straighten your knee as you raise your foot upwards as shown.   You may add cuff weights as tolerated.  Repeat 15-20 times, 2-3 times a day.
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
## 2016-12-12 ENCOUNTER — Ambulatory Visit (HOSPITAL_COMMUNITY): Payer: No Typology Code available for payment source

## 2016-12-12 DIAGNOSIS — M25661 Stiffness of right knee, not elsewhere classified: Secondary | ICD-10-CM

## 2016-12-12 DIAGNOSIS — M6281 Muscle weakness (generalized): Secondary | ICD-10-CM

## 2016-12-12 DIAGNOSIS — R262 Difficulty in walking, not elsewhere classified: Secondary | ICD-10-CM

## 2016-12-12 DIAGNOSIS — M79661 Pain in right lower leg: Secondary | ICD-10-CM | POA: Diagnosis not present

## 2016-12-12 DIAGNOSIS — R600 Localized edema: Secondary | ICD-10-CM

## 2016-12-12 DIAGNOSIS — M25671 Stiffness of right ankle, not elsewhere classified: Secondary | ICD-10-CM
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
## 2016-12-17 ENCOUNTER — Ambulatory Visit (HOSPITAL_COMMUNITY): Payer: No Typology Code available for payment source | Attending: Orthopedic Surgery | Admitting: Physical Therapy

## 2016-12-17 DIAGNOSIS — M25661 Stiffness of right knee, not elsewhere classified: Secondary | ICD-10-CM | POA: Diagnosis present

## 2016-12-17 DIAGNOSIS — M25671 Stiffness of right ankle, not elsewhere classified: Secondary | ICD-10-CM | POA: Diagnosis present

## 2016-12-17 DIAGNOSIS — R262 Difficulty in walking, not elsewhere classified: Secondary | ICD-10-CM | POA: Diagnosis present

## 2016-12-17 DIAGNOSIS — M6281 Muscle weakness (generalized): Secondary | ICD-10-CM | POA: Diagnosis present

## 2016-12-17 DIAGNOSIS — R600 Localized edema: Secondary | ICD-10-CM

## 2016-12-17 DIAGNOSIS — M79661 Pain in right lower leg: Secondary | ICD-10-CM | POA: Diagnosis not present
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
## 2016-12-19 ENCOUNTER — Ambulatory Visit (HOSPITAL_COMMUNITY): Payer: No Typology Code available for payment source

## 2016-12-19 DIAGNOSIS — M6281 Muscle weakness (generalized): Secondary | ICD-10-CM

## 2016-12-19 DIAGNOSIS — M25661 Stiffness of right knee, not elsewhere classified: Secondary | ICD-10-CM

## 2016-12-19 DIAGNOSIS — M79661 Pain in right lower leg: Secondary | ICD-10-CM

## 2016-12-19 DIAGNOSIS — M25671 Stiffness of right ankle, not elsewhere classified: Secondary | ICD-10-CM

## 2016-12-19 DIAGNOSIS — R262 Difficulty in walking, not elsewhere classified: Secondary | ICD-10-CM

## 2016-12-19 DIAGNOSIS — R600 Localized edema: Secondary | ICD-10-CM
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
## 2016-12-24 ENCOUNTER — Ambulatory Visit (HOSPITAL_COMMUNITY): Payer: No Typology Code available for payment source | Admitting: Physical Therapy

## 2016-12-24 DIAGNOSIS — R262 Difficulty in walking, not elsewhere classified: Secondary | ICD-10-CM

## 2016-12-24 DIAGNOSIS — M79661 Pain in right lower leg: Secondary | ICD-10-CM | POA: Diagnosis not present

## 2016-12-24 DIAGNOSIS — R600 Localized edema: Secondary | ICD-10-CM

## 2016-12-24 DIAGNOSIS — M6281 Muscle weakness (generalized): Secondary | ICD-10-CM

## 2016-12-24 DIAGNOSIS — M25671 Stiffness of right ankle, not elsewhere classified: Secondary | ICD-10-CM

## 2016-12-24 DIAGNOSIS — M25661 Stiffness of right knee, not elsewhere classified: Secondary | ICD-10-CM
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
## 2016-12-26 ENCOUNTER — Ambulatory Visit (HOSPITAL_COMMUNITY): Payer: No Typology Code available for payment source | Admitting: Physical Therapy

## 2016-12-30 ENCOUNTER — Ambulatory Visit (HOSPITAL_COMMUNITY): Payer: No Typology Code available for payment source | Admitting: Physical Therapy

## 2016-12-30 DIAGNOSIS — M79661 Pain in right lower leg: Secondary | ICD-10-CM

## 2016-12-30 DIAGNOSIS — M25671 Stiffness of right ankle, not elsewhere classified: Secondary | ICD-10-CM

## 2016-12-30 DIAGNOSIS — R600 Localized edema: Secondary | ICD-10-CM

## 2016-12-30 DIAGNOSIS — R262 Difficulty in walking, not elsewhere classified: Secondary | ICD-10-CM

## 2016-12-30 DIAGNOSIS — M6281 Muscle weakness (generalized): Secondary | ICD-10-CM

## 2016-12-30 DIAGNOSIS — M25661 Stiffness of right knee, not elsewhere classified: Secondary | ICD-10-CM
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
## 2017-01-01 ENCOUNTER — Ambulatory Visit (HOSPITAL_COMMUNITY): Payer: No Typology Code available for payment source

## 2017-01-01 DIAGNOSIS — R600 Localized edema: Secondary | ICD-10-CM

## 2017-01-01 DIAGNOSIS — R262 Difficulty in walking, not elsewhere classified: Secondary | ICD-10-CM

## 2017-01-01 DIAGNOSIS — M25661 Stiffness of right knee, not elsewhere classified: Secondary | ICD-10-CM

## 2017-01-01 DIAGNOSIS — M25671 Stiffness of right ankle, not elsewhere classified: Secondary | ICD-10-CM

## 2017-01-01 DIAGNOSIS — M79661 Pain in right lower leg: Secondary | ICD-10-CM

## 2017-01-01 DIAGNOSIS — M6281 Muscle weakness (generalized): Secondary | ICD-10-CM
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
## 2017-01-03 ENCOUNTER — Ambulatory Visit (HOSPITAL_COMMUNITY): Payer: No Typology Code available for payment source

## 2017-01-03 DIAGNOSIS — M25661 Stiffness of right knee, not elsewhere classified: Secondary | ICD-10-CM

## 2017-01-03 DIAGNOSIS — R262 Difficulty in walking, not elsewhere classified: Secondary | ICD-10-CM

## 2017-01-03 DIAGNOSIS — M79661 Pain in right lower leg: Secondary | ICD-10-CM | POA: Diagnosis not present

## 2017-01-03 DIAGNOSIS — M25671 Stiffness of right ankle, not elsewhere classified: Secondary | ICD-10-CM

## 2017-01-03 DIAGNOSIS — M6281 Muscle weakness (generalized): Secondary | ICD-10-CM

## 2017-01-03 DIAGNOSIS — R600 Localized edema: Secondary | ICD-10-CM
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
## 2017-01-06 ENCOUNTER — Ambulatory Visit (HOSPITAL_COMMUNITY): Payer: No Typology Code available for payment source | Admitting: Physical Therapy

## 2017-01-06 ENCOUNTER — Telehealth (HOSPITAL_COMMUNITY): Payer: Self-pay | Admitting: Physical Therapy

## 2017-01-06 DIAGNOSIS — M6281 Muscle weakness (generalized): Secondary | ICD-10-CM

## 2017-01-06 DIAGNOSIS — R600 Localized edema: Secondary | ICD-10-CM

## 2017-01-06 DIAGNOSIS — M79661 Pain in right lower leg: Secondary | ICD-10-CM

## 2017-01-06 DIAGNOSIS — M25671 Stiffness of right ankle, not elsewhere classified: Secondary | ICD-10-CM

## 2017-01-06 DIAGNOSIS — M25661 Stiffness of right knee, not elsewhere classified: Secondary | ICD-10-CM

## 2017-01-06 DIAGNOSIS — R262 Difficulty in walking, not elsewhere classified: Secondary | ICD-10-CM
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
## 2017-01-06 NOTE — Telephone Encounter (Signed)
Called and spoke to patient, relayed MD office's advice that he can now perform WBAT activities with reduced use of crutches as long as pain does not increase. Patient agreeable, voiced understanding this evening.  Nedra HaiKristen Byron Tipping PT, DPT [phone]

## 2017-01-06 NOTE — Telephone Encounter (Signed)
Called Dr. William Hamburgeroger's office requesting clarification on weight bearing status (IE when boot can come off, when he will be 100% WB); staff reports that they will check and call us back.  Nedra HaiKristen Collan Schoenfeld PT, DPT [phone]

## 2017-01-06 NOTE — Telephone Encounter (Signed)
Clinical staff from referring MD's office returns phone call, and reports that MD had wanted him to progress to WBAT. They state that patient is clear to perform WBAT based activities without crutches as long as pain does not increase at this point.  Nedra HaiKristen Lorree Millar PT, DPT [phone]

## 2017-01-08 ENCOUNTER — Ambulatory Visit (HOSPITAL_COMMUNITY): Payer: No Typology Code available for payment source | Admitting: Physical Therapy

## 2017-01-08 DIAGNOSIS — M6281 Muscle weakness (generalized): Secondary | ICD-10-CM

## 2017-01-08 DIAGNOSIS — M79661 Pain in right lower leg: Secondary | ICD-10-CM | POA: Diagnosis not present

## 2017-01-08 DIAGNOSIS — M25661 Stiffness of right knee, not elsewhere classified: Secondary | ICD-10-CM

## 2017-01-08 DIAGNOSIS — R262 Difficulty in walking, not elsewhere classified: Secondary | ICD-10-CM

## 2017-01-08 DIAGNOSIS — R600 Localized edema: Secondary | ICD-10-CM

## 2017-01-08 DIAGNOSIS — M25671 Stiffness of right ankle, not elsewhere classified: Secondary | ICD-10-CM
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
## 2017-01-10 ENCOUNTER — Ambulatory Visit (HOSPITAL_COMMUNITY): Payer: No Typology Code available for payment source

## 2017-01-10 DIAGNOSIS — M25661 Stiffness of right knee, not elsewhere classified: Secondary | ICD-10-CM

## 2017-01-10 DIAGNOSIS — R600 Localized edema: Secondary | ICD-10-CM

## 2017-01-10 DIAGNOSIS — R262 Difficulty in walking, not elsewhere classified: Secondary | ICD-10-CM

## 2017-01-10 DIAGNOSIS — M25671 Stiffness of right ankle, not elsewhere classified: Secondary | ICD-10-CM

## 2017-01-10 DIAGNOSIS — M6281 Muscle weakness (generalized): Secondary | ICD-10-CM

## 2017-01-10 DIAGNOSIS — M79661 Pain in right lower leg: Secondary | ICD-10-CM | POA: Diagnosis not present
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
## 2017-01-15 ENCOUNTER — Ambulatory Visit (HOSPITAL_COMMUNITY): Payer: No Typology Code available for payment source | Admitting: Physical Therapy

## 2017-01-15 DIAGNOSIS — R262 Difficulty in walking, not elsewhere classified: Secondary | ICD-10-CM

## 2017-01-15 DIAGNOSIS — R600 Localized edema: Secondary | ICD-10-CM

## 2017-01-15 DIAGNOSIS — M25661 Stiffness of right knee, not elsewhere classified: Secondary | ICD-10-CM

## 2017-01-15 DIAGNOSIS — M25671 Stiffness of right ankle, not elsewhere classified: Secondary | ICD-10-CM

## 2017-01-15 DIAGNOSIS — M79661 Pain in right lower leg: Secondary | ICD-10-CM

## 2017-01-15 DIAGNOSIS — M6281 Muscle weakness (generalized): Secondary | ICD-10-CM
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
## 2017-01-17 ENCOUNTER — Ambulatory Visit (HOSPITAL_COMMUNITY): Payer: No Typology Code available for payment source | Attending: Orthopedic Surgery

## 2017-01-17 DIAGNOSIS — R262 Difficulty in walking, not elsewhere classified: Secondary | ICD-10-CM | POA: Diagnosis present

## 2017-01-17 DIAGNOSIS — M6281 Muscle weakness (generalized): Secondary | ICD-10-CM | POA: Insufficient documentation

## 2017-01-17 DIAGNOSIS — M25661 Stiffness of right knee, not elsewhere classified: Secondary | ICD-10-CM | POA: Diagnosis present

## 2017-01-17 DIAGNOSIS — M79661 Pain in right lower leg: Secondary | ICD-10-CM | POA: Insufficient documentation

## 2017-01-17 DIAGNOSIS — M25671 Stiffness of right ankle, not elsewhere classified: Secondary | ICD-10-CM | POA: Insufficient documentation

## 2017-01-17 DIAGNOSIS — R600 Localized edema: Secondary | ICD-10-CM | POA: Insufficient documentation
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
## 2017-01-20 ENCOUNTER — Ambulatory Visit (HOSPITAL_COMMUNITY): Payer: No Typology Code available for payment source | Admitting: Physical Therapy

## 2017-01-20 DIAGNOSIS — M79661 Pain in right lower leg: Secondary | ICD-10-CM

## 2017-01-20 DIAGNOSIS — R600 Localized edema: Secondary | ICD-10-CM

## 2017-01-20 DIAGNOSIS — M25661 Stiffness of right knee, not elsewhere classified: Secondary | ICD-10-CM

## 2017-01-20 DIAGNOSIS — M6281 Muscle weakness (generalized): Secondary | ICD-10-CM

## 2017-01-20 DIAGNOSIS — R262 Difficulty in walking, not elsewhere classified: Secondary | ICD-10-CM

## 2017-01-20 DIAGNOSIS — M25671 Stiffness of right ankle, not elsewhere classified: Secondary | ICD-10-CM
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
## 2017-01-22 ENCOUNTER — Ambulatory Visit (HOSPITAL_COMMUNITY): Payer: No Typology Code available for payment source | Admitting: Physical Therapy

## 2017-01-22 DIAGNOSIS — R600 Localized edema: Secondary | ICD-10-CM

## 2017-01-22 DIAGNOSIS — M79661 Pain in right lower leg: Secondary | ICD-10-CM | POA: Diagnosis not present

## 2017-01-22 DIAGNOSIS — M6281 Muscle weakness (generalized): Secondary | ICD-10-CM

## 2017-01-22 DIAGNOSIS — M25671 Stiffness of right ankle, not elsewhere classified: Secondary | ICD-10-CM

## 2017-01-22 DIAGNOSIS — M25661 Stiffness of right knee, not elsewhere classified: Secondary | ICD-10-CM

## 2017-01-22 DIAGNOSIS — R262 Difficulty in walking, not elsewhere classified: Secondary | ICD-10-CM

## 2017-01-22 NOTE — Patient Instructions (Signed)
   Stand to [employer]  Start in standing near the front of the chair. Cross your arms over your chest or out of front of you (so they don't assist you). Very SLOWLY (should take you 4 seconds), start sitting. -Keep your bottom back -Don't let your knees travel past your toes -Keep knee's pointing forward (don't let touch) -Have your feet firmly planted in ground -I should be able to say, "Pause" at any point  To make it harder, you can slide your right leg further back and push your left leg forward.  Repeat 15 times, 2 times a day.    WALL SQUATS  Leaning up against a wall or closed door on your back, slide your body downward and then return back to upright position.  A door was used here because it was smoother and had less friction than the wall.   Knees should bend in line with the 2nd toe and not pass the front of the foot.  Hold for at least 5 seconds.  Repeat 15 times, twice a day.      Lunge  Step left leg forward and bend both knees to 90 degrees. Keep front leg in straight forward alignment and most of body weight on front leg.  Push off of front leg to return to standing.     You may need to modify this as we did in clinic (stepping backwards instead of forwards) due to the boot.   Alternate legs.  Repeat 10 times each leg, 2 times a day.
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
## 2017-01-24 ENCOUNTER — Ambulatory Visit (HOSPITAL_COMMUNITY): Payer: No Typology Code available for payment source

## 2017-01-24 DIAGNOSIS — R262 Difficulty in walking, not elsewhere classified: Secondary | ICD-10-CM

## 2017-01-24 DIAGNOSIS — M79661 Pain in right lower leg: Secondary | ICD-10-CM

## 2017-01-24 DIAGNOSIS — M25671 Stiffness of right ankle, not elsewhere classified: Secondary | ICD-10-CM

## 2017-01-24 DIAGNOSIS — R600 Localized edema: Secondary | ICD-10-CM

## 2017-01-24 DIAGNOSIS — M6281 Muscle weakness (generalized): Secondary | ICD-10-CM

## 2017-01-24 DIAGNOSIS — M25661 Stiffness of right knee, not elsewhere classified: Secondary | ICD-10-CM
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
## 2017-01-30 ENCOUNTER — Ambulatory Visit (HOSPITAL_COMMUNITY): Payer: No Typology Code available for payment source | Admitting: Physical Therapy

## 2017-01-30 DIAGNOSIS — M79661 Pain in right lower leg: Secondary | ICD-10-CM | POA: Diagnosis not present

## 2017-01-30 DIAGNOSIS — M25661 Stiffness of right knee, not elsewhere classified: Secondary | ICD-10-CM

## 2017-01-30 DIAGNOSIS — R262 Difficulty in walking, not elsewhere classified: Secondary | ICD-10-CM

## 2017-01-30 DIAGNOSIS — R600 Localized edema: Secondary | ICD-10-CM

## 2017-01-30 DIAGNOSIS — M25671 Stiffness of right ankle, not elsewhere classified: Secondary | ICD-10-CM

## 2017-01-30 DIAGNOSIS — M6281 Muscle weakness (generalized): Secondary | ICD-10-CM

## 2017-01-30 NOTE — Patient Instructions (Addendum)
   FIRE HYDRANT - QUADRUPED HIP ABDUCTION  Start in a crawl position and raise your leg out to the side as shown. Maintain a straight upper and mid back.  Repeat 15-20 times, 2 sets alternating with exercise below.    Quadruped Glut Extension  Begin on hands and knees with your wrists directly under your shoulders and knees directly under your hips. Maintain tight abdominals and a flat back. Keep your knee bent and extend your hips as if you are kicking the ceiling (your hip acts as the axis of motion). Lower down and repeat this motion. Do not let your spine rock side to side and keep a flat back throughout this exercise.  Repeat 15-20 times, 2 sets alternating with exercise above     Eccentric Single Leg Squat  1) Begin standing in front of a secure chair braced in front of a wall 2) Lift the unaffected leg  3) Bend at your hips sitting them back towards the chair 4) Keeping your knee behind and in line with your toes, squat down till your hips gently contact the chair 5) Use both legs to stand back up.  6) Repeat 10-15 times, twice a day.   AS ALWAYS, IF YOU HAVE PAIN AT FRACTURE SITE, STOP- THESE SHOULD BE PAIN FREE/YOU SHOULD ONLY HAVE MUSCULAR FATIGUE
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
## 2017-02-06 ENCOUNTER — Telehealth (HOSPITAL_COMMUNITY): Payer: Self-pay | Admitting: Physical Therapy

## 2017-02-06 ENCOUNTER — Ambulatory Visit (HOSPITAL_COMMUNITY): Payer: No Typology Code available for payment source | Admitting: Physical Therapy

## 2017-02-06 ENCOUNTER — Encounter (HOSPITAL_COMMUNITY): Payer: Self-pay | Admitting: Physical Therapy

## 2017-02-06 NOTE — Therapy (Signed)
[place], [place], [address] Phone: [phone]   Fax:  [phone]  Patient Details  Name: Ricky [name] MRN: [id number] Date of Birth: 29-Oct-1995 Referring Provider:  No ref. provider found  Encounter Date: 02/06/2017   PHYSICAL THERAPY DISCHARGE SUMMARY  Visits from Start of Care: 20  Current functional level related to goals / functional outcomes: Patient has done well with skilled PT services with recent focus being on closed chain strengthening as tolerated with WBAT status in boot. He has cancelled his last appointment with request to be discharged (DC today had been previously discussed with this DPT), and states he is extremely satisfied with service provided and with his outcomes with skilled PT services. DC today.    Remaining deficits: Functional strength deficits/reduced LE muscle endurance    Education / Equipment: DC today  Plan: Patient agrees to discharge.  Patient goals were partially met. Patient is being discharged due to being pleased with the current functional level.  ?????     Deniece Ree PT, DPT [facility] [address] [place], [place], [address] Phone: [phone]   Fax:  [phone]

## 2017-02-06 NOTE — Telephone Encounter (Signed)
Pt called requested to be D/c today he can not keep this apptment. He states his service has been great and he especially wanted to thank Lerry LinerKristen U and [facility]yland GroupCasey C.

## 2017-02-20 ENCOUNTER — Telehealth: Payer: Self-pay | Admitting: *Deleted

## 2017-02-20 NOTE — Telephone Encounter (Signed)
Patient called to schedule an appointment to get his medications refilled, I offered patient an appointment for next Friday and he said he only has 6 days left on his medications, patient requesting to be seen sooner than the 13th due to medications running out. When can I schedule this patient? [phone]

## 2017-02-20 NOTE — Telephone Encounter (Signed)
He does need to be seen. We will not let him run out.

## 2017-02-20 NOTE — Telephone Encounter (Signed)
Patient states he will call the office in the morning to get a same day.

## 2017-02-21 ENCOUNTER — Telehealth: Payer: Self-pay | Admitting: Family Medicine

## 2017-02-21 DIAGNOSIS — F429 Obsessive-compulsive disorder, unspecified: Secondary | ICD-10-CM

## 2017-02-21 MED ORDER — SERTRALINE HCL 100 MG PO TABS: ORAL_TABLET | ORAL | 0 refills | Status: DC

## 2017-02-21 NOTE — Telephone Encounter (Signed)
Patient has 5 days left of his Rx Sertraline 100mg  Pharmacy is [facility] appt made w/Dr Delton SeeNelson on 02/28/17

## 2017-02-21 NOTE — Telephone Encounter (Signed)
rx done

## 2017-02-28 ENCOUNTER — Encounter: Payer: Self-pay | Admitting: Family Medicine

## 2017-02-28 ENCOUNTER — Ambulatory Visit (INDEPENDENT_AMBULATORY_CARE_PROVIDER_SITE_OTHER): Payer: Self-pay | Admitting: Family Medicine

## 2017-02-28 ENCOUNTER — Ambulatory Visit: Payer: Medicaid Other | Admitting: Family Medicine

## 2017-02-28 DIAGNOSIS — F429 Obsessive-compulsive disorder, unspecified: Secondary | ICD-10-CM

## 2017-02-28 MED ORDER — SERTRALINE HCL 100 MG PO TABS: ORAL_TABLET | ORAL | 1 refills | Status: DC
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
## 2017-02-28 NOTE — Patient Instructions (Signed)
See me yearly Call for problems 

## 2017-03-03 NOTE — Addendum Note (Signed)
Addendum  created 03/03/17 1730 by Clarisa Danser, MD   Sign clinical note    

## 2017-10-13 ENCOUNTER — Other Ambulatory Visit: Payer: Self-pay

## 2017-10-13 ENCOUNTER — Ambulatory Visit (INDEPENDENT_AMBULATORY_CARE_PROVIDER_SITE_OTHER): Payer: Self-pay | Admitting: Family Medicine

## 2017-10-13 ENCOUNTER — Encounter: Payer: Self-pay | Admitting: Family Medicine

## 2017-10-13 VITALS — BP 118/70 | HR 60 | Temp 98.9°F | Resp 18 | Ht 68.0 in | Wt 169.0 lb

## 2017-10-13 DIAGNOSIS — F429 Obsessive-compulsive disorder, unspecified: Secondary | ICD-10-CM

## 2017-10-13 MED ORDER — SERTRALINE HCL 100 MG PO TABS: ORAL_TABLET | ORAL | 3 refills | Status: AC

## 2017-10-13 MED ORDER — SERTRALINE HCL 100 MG PO TABS: ORAL_TABLET | ORAL | 0 refills | Status: DC

## 2017-10-13 NOTE — Patient Instructions (Signed)
Continue the sertraline Exercise every day that you are able
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
## 2017-10-27 ENCOUNTER — Encounter: Payer: Self-pay | Admitting: Family Medicine
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
## 2023-02-18 ENCOUNTER — Emergency Department (HOSPITAL_COMMUNITY)
Admission: EM | Admit: 2023-02-18 | Discharge: 2023-02-18 | Disposition: A | Discharge status: Home / Self Care | Payer: Self-pay | Attending: Emergency Medicine | Admitting: Emergency Medicine

## 2023-02-18 ENCOUNTER — Other Ambulatory Visit: Payer: Self-pay

## 2023-02-18 ENCOUNTER — Encounter (HOSPITAL_COMMUNITY): Payer: Self-pay

## 2023-02-18 DIAGNOSIS — B002 Herpesviral gingivostomatitis and pharyngotonsillitis: Secondary | ICD-10-CM | POA: Insufficient documentation

## 2023-02-18 LAB — GROUP A STREP BY PCR: Group A Strep by PCR: NOT DETECTED

## 2023-02-18 MED ORDER — LIDOCAINE VISCOUS HCL 2 % MT SOLN: 15.0000 mL | OROMUCOSAL | 0 refills | Status: AC | PRN

## 2023-02-18 MED ORDER — ACYCLOVIR 400 MG PO TABS: 400.0000 mg | ORAL_TABLET | Freq: Three times a day (TID) | ORAL | 0 refills | Status: AC

## 2023-02-18 MED ORDER — DEXAMETHASONE SODIUM PHOSPHATE 10 MG/ML IJ SOLN
10.0000 mg | Freq: Once | INTRAMUSCULAR | Status: AC
  Administered 2023-02-18: 10 mg via INTRAVENOUS
  Filled 2023-02-18: qty 1

## 2023-02-18 MED ORDER — SODIUM CHLORIDE 0.9 % IV BOLUS
1000.0000 mL | Freq: Once | INTRAVENOUS | Status: AC
  Administered 2023-02-18: 1000 mL via INTRAVENOUS

## 2023-02-18 MED ORDER — DEXAMETHASONE 4 MG PO TABS: 6.0000 mg | ORAL_TABLET | Freq: Once | ORAL | Status: DC

## 2023-02-18 NOTE — ED Triage Notes (Signed)
Sore throat for 6 days, decreased intake due to pain.  Prescribed azithromycin from online doctor yesterday and has been taking that for sx.

## 2023-02-18 NOTE — Discharge Instructions (Addendum)
Thank you for allowing me to be part of your care today.  You were evaluated in the ED for severe sore throat.  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  I am sending you home on an antiviral called acyclovir that you will take 3 times a day for the next 10 days.  I have also sent over a lidocaine solution that you may swish in your mouth and throat to help with the pain.  He received a one-time IV steroid dose while in the ED to help with the swelling and pain in the back of your throat.  Please follow-up with your primary care provider.   Return to the ED if you develop sudden worsening of your symptoms, are unable to swallow, or not able to hydrate by drinking liquids, or if you have any new concerns.

## 2023-02-18 NOTE — ED Provider Notes (Signed)
[facility] AT [facility] Provider Note   CSN: [id number] Arrival date & time: 02/18/23  1752     History  Chief Complaint  Patient presents with   Sore Throat    Ricky [name] is a 27 y.o. male presents to the ED complaining of a sore throat that has been worsening over the past 6 days.  He states that he has had painful lesions inside the mouth and along his gums with swelling of his gums.  Patient with decreased oral intake due to the pain in his throat.  Patient has been able to drink some liquids, but is not staying well-hydrated and has had a decrease in urination.  He has had associated subjective fever, chills, and sweats.  He reports a new sexual partner approximately 3 weeks ago and states there was oral sex.  Denies facial swelling, shortness of breath, stridor.         Home Medications Prior to Admission medications   Medication Sig Start Date End Date Taking? Authorizing Provider  acyclovir (ZOVIRAX) 400 MG tablet Take 1 tablet (400 mg total) by mouth 3 (three) times daily for 10 days. 02/18/23 02/28/23 Yes Shalunda Lindh R, PA-C  lidocaine (XYLOCAINE) 2 % solution Use as directed 15 mLs in the mouth or throat as needed for mouth pain. 02/18/23  Yes Doreen Garretson R, PA-C  sertraline (ZOLOFT) 100 MG tablet Two tabs in am. 10/13/17   Eustace Moore, MD      Allergies    Patient has no known allergies.    Review of Systems   Review of Systems  Constitutional:  Positive for chills, diaphoresis and fever.  HENT:  Positive for mouth sores, sore throat and trouble swallowing. Negative for facial swelling.   Respiratory:  Negative for shortness of breath and stridor.     Physical Exam Updated Vital Signs BP (!) 159/87 (BP Location: Left Arm)   Pulse 100   Temp 98.5 F (36.9 C) (Oral)   Resp 16   Ht 5\' 8"  (1.727 m)   Wt 79.4 kg   SpO2 96%   BMI 26.61 kg/m  Physical Exam Vitals and nursing note reviewed.  Constitutional:       General: He is not in acute distress.    Appearance: Normal appearance. He is not ill-appearing or diaphoretic.  HENT:     Mouth/Throat:     Lips: Lesions present.     Mouth: Mucous membranes are moist.     Dentition: Gingival swelling and gum lesions present.     Pharynx: Uvula midline. Oropharyngeal exudate and posterior oropharyngeal erythema present. No pharyngeal swelling or uvula swelling.     Comments: Tonsils are surgically absent.  There is a exudative film on the right side of posterior oropharynx.  Multiple ulcerative lesions to the posterior oropharynx, palate, gingiva and labial mucosa.   Cardiovascular:     Rate and Rhythm: Normal rate and regular rhythm.  Pulmonary:     Effort: Pulmonary effort is normal.  Skin:    General: Skin is warm and dry.     Capillary Refill: Capillary refill takes less than 2 seconds.  Neurological:     Mental Status: He is alert. Mental status is at baseline.  Psychiatric:        Mood and Affect: Mood normal.        Behavior: Behavior normal.     ED Results / Procedures / Treatments   Labs (all labs ordered are listed,  but only abnormal results are displayed) Labs Reviewed  GROUP A STREP BY PCR    EKG None  Radiology No results found.  Procedures Procedures    Medications Ordered in ED Medications  sodium chloride 0.9 % bolus 1,000 mL (1,000 mLs Intravenous Bolus 02/18/23 2105)  dexamethasone (DECADRON) injection 10 mg (10 mg Intravenous Given 02/18/23 2104)    ED Course/ Medical Decision Making/ A&P                             Medical Decision Making Risk Prescription drug management.   This patient presents to the ED with chief complaint(s) of sore throat, lesions inside the mouth with non-contributory past medical history. The complaint involves an extensive differential diagnosis and also carries with it a high risk of complications and morbidity.    The differential diagnosis includes strep pharyngitis, viral  pharyngitis, herpangina, herpetic gingivostomatitis, mono, other infectious etiology   Initial Assessment:   Exam significant for exudative film on the right-sided posterior oropharynx along with erythema.  Multiple ulcerative lesions to the posterior oropharynx, palate, gingiva, and labial mucosa.  No posterior oropharynx swelling or uvular swelling.  Tonsils are surgically absent.  Mucous membranes are moist.  Erythema surrounding ulcerative lesions.  Gingival swelling present.  Treatment and Reassessment: Will give patient IV fluids and IV Decadron for throat pain and suspected dehydration due to poor oral intake.  Patient is otherwise well-appearing.  Disposition:   Will send patient home with lidocaine oral rinse to help with throat and mouth pain.  Suspect symptoms are related to herpetic gingivostomatitis given presentation and patient having new sexual partner.  Will also send patient home on 10-day course of acyclovir.   Recommended follow-up with primary care provider within a week.  The patient has been appropriately medically screened and/or stabilized in the ED. I have low suspicion for any other emergent medical condition which would require further screening, evaluation or treatment in the ED or require inpatient management. At time of discharge the patient is hemodynamically stable and in no acute distress. I have discussed work-up results and diagnosis with patient and answered all questions. Patient is agreeable with discharge plan. We discussed strict return precautions for returning to the emergency department and they verbalized understanding.             Final Clinical Impression(s) / ED Diagnoses Final diagnoses:  Herpetic gingivostomatitis    Rx / DC Orders ED Discharge Orders          Ordered    lidocaine (XYLOCAINE) 2 % solution  As needed        02/18/23 2131    acyclovir (ZOVIRAX) 400 MG tablet  3 times daily        02/18/23 2131               Barrie Lyme 02/18/23 2234    Linwood Dibbles, MD 02/18/23 2351
